# Patient Record
Sex: Male | Born: 1955 | Race: Black or African American | Hispanic: No | Marital: Single | Smoking: Current every day smoker
Health system: Southern US, Community
[De-identification: ages and names within clinical notes are randomized; demographics above are authoritative.]

## PROBLEM LIST (undated history)

## (undated) DIAGNOSIS — M1990 Unspecified osteoarthritis, unspecified site: Secondary | ICD-10-CM

## (undated) DIAGNOSIS — E785 Hyperlipidemia, unspecified: Secondary | ICD-10-CM

## (undated) DIAGNOSIS — I10 Essential (primary) hypertension: Secondary | ICD-10-CM

## (undated) DIAGNOSIS — K219 Gastro-esophageal reflux disease without esophagitis: Secondary | ICD-10-CM

## (undated) DIAGNOSIS — C169 Malignant neoplasm of stomach, unspecified: Secondary | ICD-10-CM

## (undated) HISTORY — DX: Essential (primary) hypertension: I10

## (undated) HISTORY — DX: Hyperlipidemia, unspecified: E78.5

## (undated) HISTORY — PX: TUMOR REMOVAL: SHX12

## (undated) HISTORY — DX: Unspecified osteoarthritis, unspecified site: M19.90

---

## 2009-06-27 ENCOUNTER — Emergency Department: Payer: Self-pay | Admitting: Internal Medicine

## 2009-10-12 ENCOUNTER — Inpatient Hospital Stay: Payer: Self-pay | Admitting: Specialist

## 2009-12-14 ENCOUNTER — Ambulatory Visit: Payer: Self-pay | Admitting: Gastroenterology

## 2009-12-15 ENCOUNTER — Ambulatory Visit: Payer: Self-pay | Admitting: Surgery

## 2009-12-29 ENCOUNTER — Ambulatory Visit: Payer: Self-pay | Admitting: Surgery

## 2010-01-03 ENCOUNTER — Inpatient Hospital Stay: Payer: Self-pay | Admitting: Surgery

## 2010-01-10 LAB — PATHOLOGY REPORT

## 2010-01-11 ENCOUNTER — Ambulatory Visit: Payer: Self-pay | Admitting: Cardiovascular Disease

## 2010-01-13 ENCOUNTER — Ambulatory Visit: Payer: Self-pay | Admitting: Oncology

## 2010-01-16 ENCOUNTER — Inpatient Hospital Stay: Payer: Self-pay | Admitting: Surgery

## 2010-01-28 ENCOUNTER — Ambulatory Visit: Payer: Self-pay | Admitting: Oncology

## 2010-02-13 ENCOUNTER — Ambulatory Visit: Payer: Self-pay | Admitting: Oncology

## 2010-02-14 ENCOUNTER — Other Ambulatory Visit: Payer: Self-pay | Admitting: Surgery

## 2010-02-17 ENCOUNTER — Ambulatory Visit: Payer: Self-pay | Admitting: Oncology

## 2010-02-24 ENCOUNTER — Ambulatory Visit: Payer: Self-pay | Admitting: Surgery

## 2010-03-16 ENCOUNTER — Ambulatory Visit: Payer: Self-pay | Admitting: Oncology

## 2010-04-18 ENCOUNTER — Ambulatory Visit: Payer: Self-pay | Admitting: Surgery

## 2010-04-21 ENCOUNTER — Ambulatory Visit: Payer: Self-pay | Admitting: Oncology

## 2010-05-03 ENCOUNTER — Ambulatory Visit: Payer: Self-pay | Admitting: Oncology

## 2010-05-15 ENCOUNTER — Ambulatory Visit: Payer: Self-pay | Admitting: Oncology

## 2010-08-02 ENCOUNTER — Ambulatory Visit: Payer: Self-pay | Admitting: Oncology

## 2010-08-08 ENCOUNTER — Ambulatory Visit: Payer: Self-pay | Admitting: Oncology

## 2010-08-14 ENCOUNTER — Ambulatory Visit: Payer: Self-pay | Admitting: Oncology

## 2010-11-02 ENCOUNTER — Ambulatory Visit: Payer: Self-pay | Admitting: Oncology

## 2010-11-07 ENCOUNTER — Ambulatory Visit: Payer: Self-pay | Admitting: Oncology

## 2010-11-14 ENCOUNTER — Ambulatory Visit: Payer: Self-pay | Admitting: Oncology

## 2011-02-21 ENCOUNTER — Ambulatory Visit: Payer: Self-pay | Admitting: Gastroenterology

## 2011-02-22 LAB — PATHOLOGY REPORT

## 2011-02-24 ENCOUNTER — Ambulatory Visit: Payer: Self-pay | Admitting: Gastroenterology

## 2011-03-06 ENCOUNTER — Ambulatory Visit: Payer: Self-pay | Admitting: Oncology

## 2011-03-10 ENCOUNTER — Ambulatory Visit: Payer: Self-pay | Admitting: Oncology

## 2011-03-10 LAB — CBC CANCER CENTER
Basophil %: 2.2 %
Eosinophil #: 0.1 x10 3/mm (ref 0.0–0.7)
Eosinophil %: 2.3 %
HCT: 46.1 % (ref 40.0–52.0)
HGB: 15.5 g/dL (ref 13.0–18.0)
Lymphocyte %: 36.4 %
MCV: 90 fL (ref 80–100)
Monocyte %: 15.7 %
Neutrophil #: 2.6 x10 3/mm (ref 1.4–6.5)
Neutrophil %: 43.4 %
RDW: 15.5 % — ABNORMAL HIGH (ref 11.5–14.5)
WBC: 5.9 x10 3/mm (ref 3.8–10.6)

## 2011-03-17 ENCOUNTER — Ambulatory Visit: Payer: Self-pay | Admitting: Oncology

## 2011-04-09 IMAGING — CT CT ABD-PELV W/O CM
1 of 2 series · 14 of 32 positions shown, 18 images · non-contrast
Comparison: none

REASON FOR EXAM: (1) gastric leak; (2) gastric leak
COMMENTS:

[Series 2: 3mm soft tissue · axial · 0.62mm/px · z∈[-662,-256]mm · 14 of 155 slices shown, 18 images]
[im 13/155  soft-tissue]
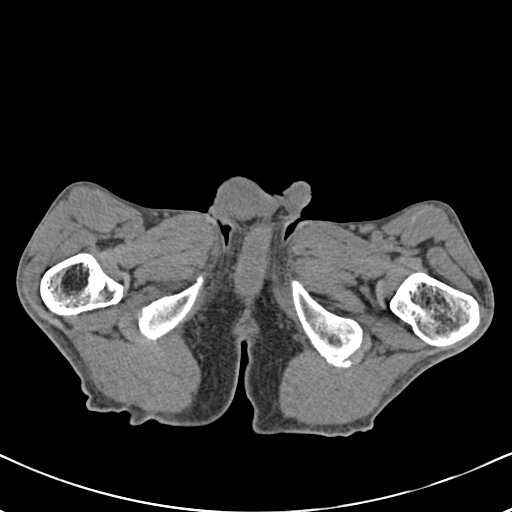
[im 13/155  bone]
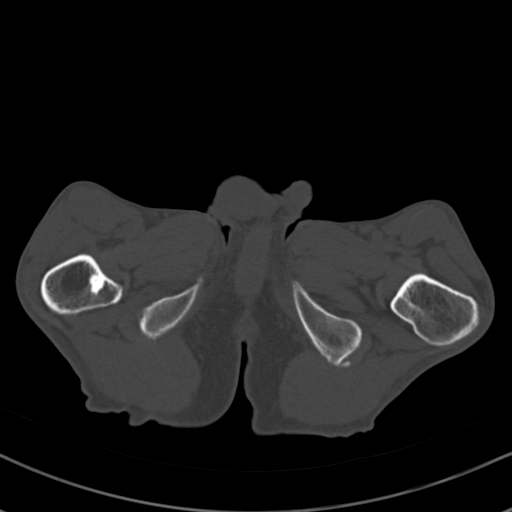
[im 25/155  soft-tissue]
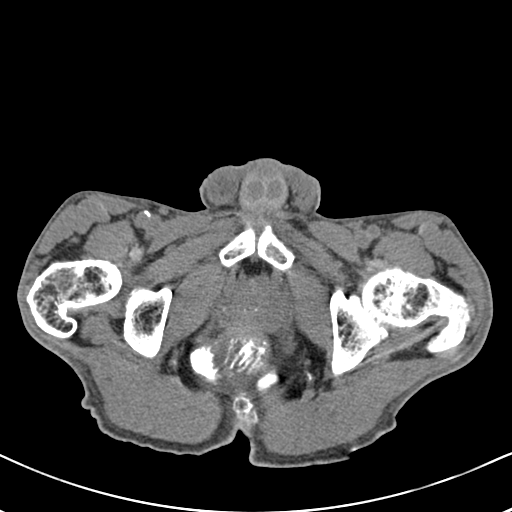
[im 37/155  soft-tissue]
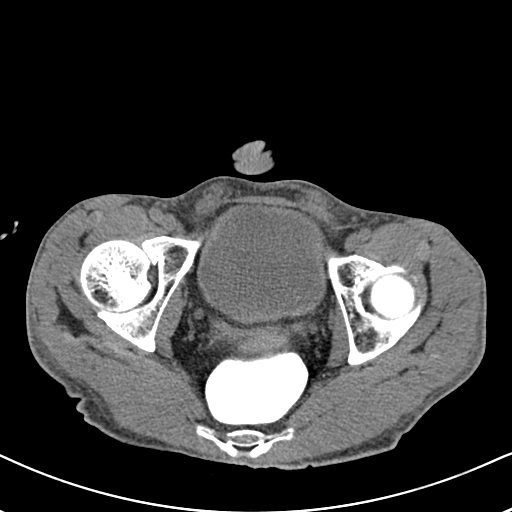
[im 50/155  soft-tissue]
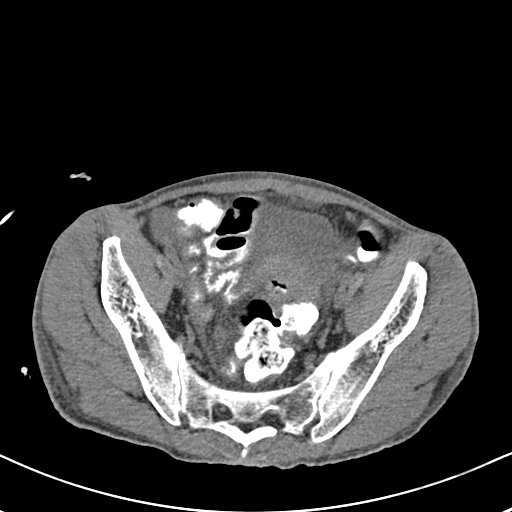
[im 62/155  soft-tissue]
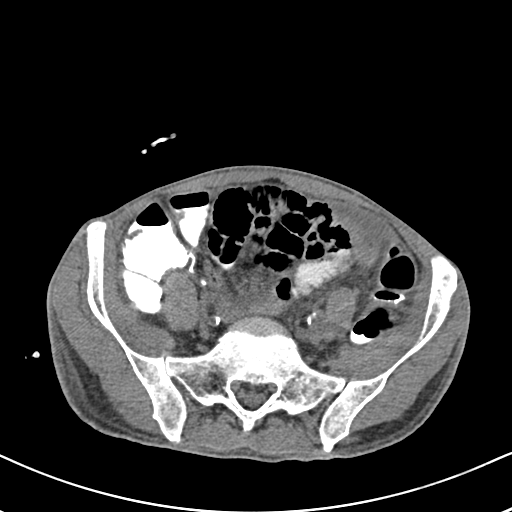
[im 74/155  soft-tissue]
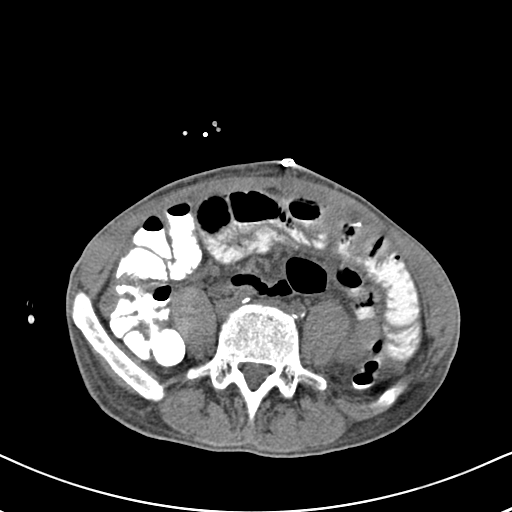
[im 87/155  soft-tissue]
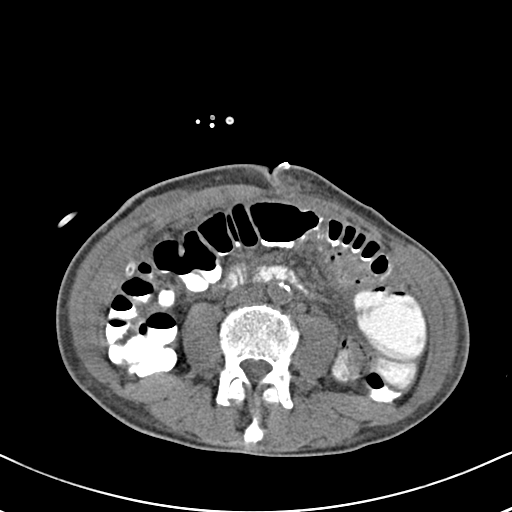
[im 99/155  soft-tissue]
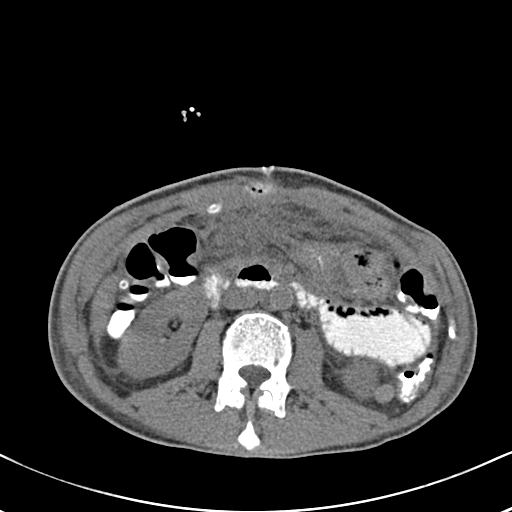
[im 111/155  soft-tissue]
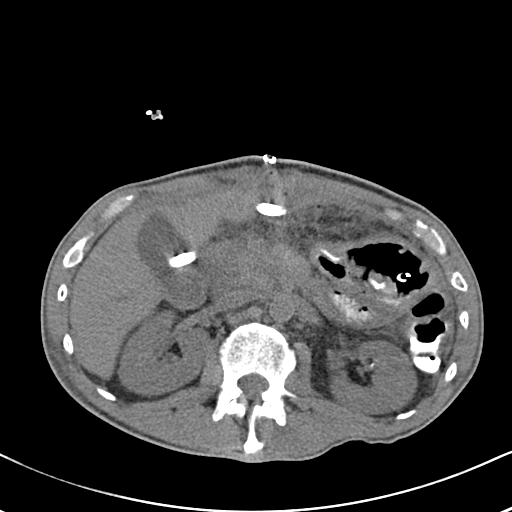
[im 111/155  bone]
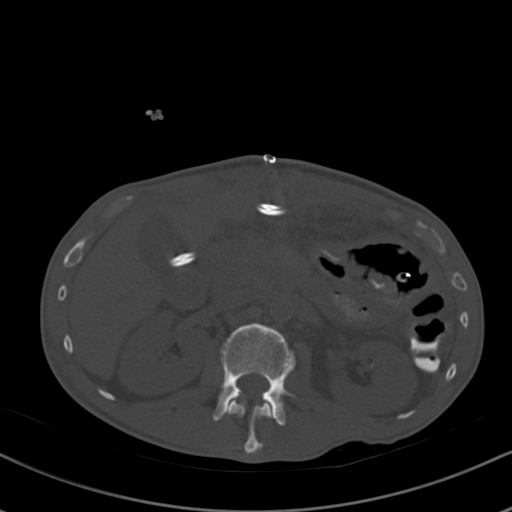
[im 124/155  soft-tissue]
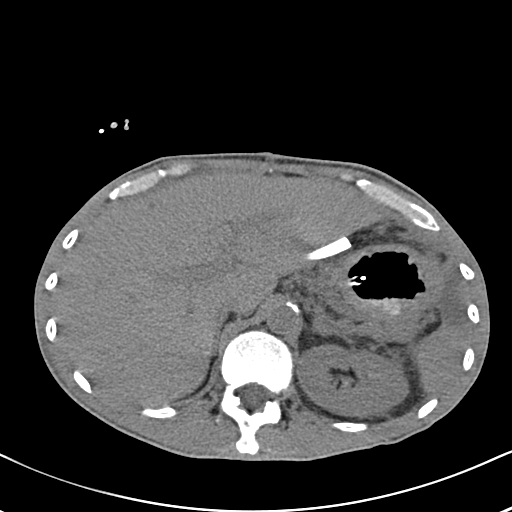
[im 130/155  lung]
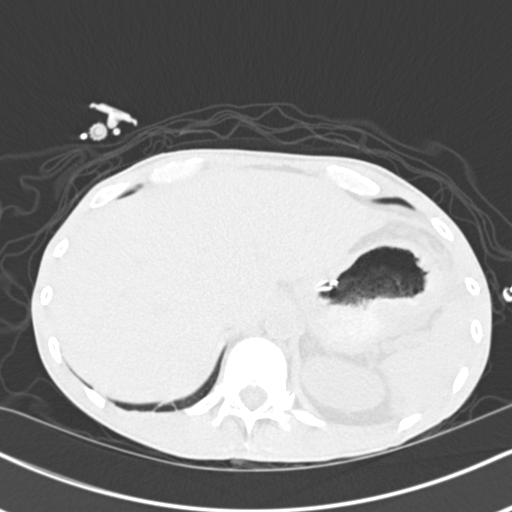
[im 136/155  soft-tissue]
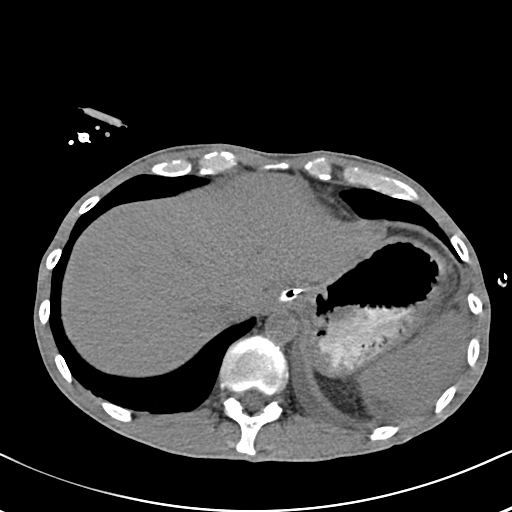
[im 136/155  lung]
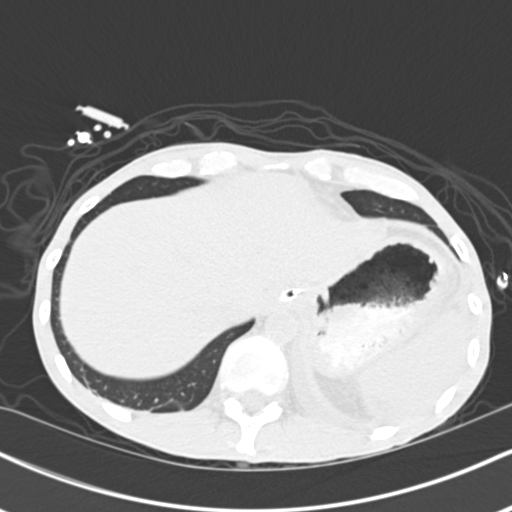
[im 142/155  lung]
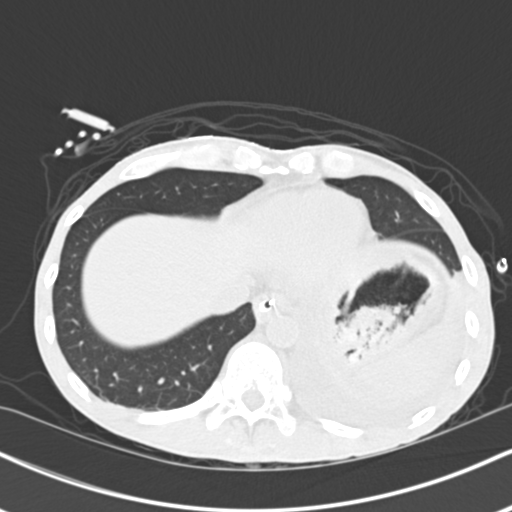
[im 148/155  soft-tissue]
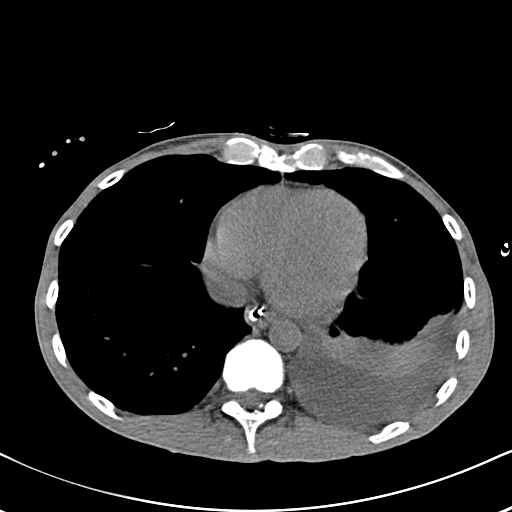
[im 148/155  lung]
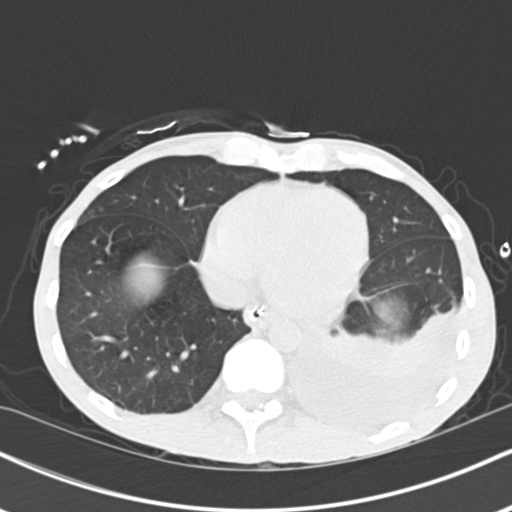

[14 of 32 positions shown; findings below may reference images not displayed]

PROCEDURE:     CT  - CT ABDOMEN AND PELVIS W[DATE]  [DATE]

RESULT:     Axial CT scanning was performed through the abdomen and pelvis
at 3 mm intervals and slice thicknesses. The patient did not receive IV
contrast but did receive oral contrast. Review of multiplanar reconstructed
images was performed separately on the VIA monitor.

The orally administered contrast has largely exited the stomach and lies
within portions of the small bowel with the majority in the large bowel in a
loading the rectum. I do not see definite extraluminal contrast collections.
There do remain inflammatory changes in the perigastric region. There are 2
drainage catheters in place in the anterior perigastric space. There is also
a nasogastric tube in place the tip lies in the region of the gastric
fundus. There is no evidence of ileus or obstruction of the small or large
bowel.

The liver exhibits no a focal mass. The gallbladder is adequately distended
with no calcified stones. The pancreatic head is poorly defined this is not
a new finding. The kidneys exhibit no evidence of obstruction but there are
punctate calcified stones present. The caliber of the abdominal aorta is
normal. Within the pelvis there is a moderate amount of free fluid posterior
to the urinary bladder anterior to the rectum. There is a small amount of
gas within the urinary bladder. The prostate gland produces moderate
impression upon the urinary bladder base.

There is a moderate size left pleural effusion and there is left basilar
atelectasis and/or infiltrate.
IMPRESSION: 1. I do not see evidence of extravasation of contrast into the perigastric
spaces. There do remain inflammatory changes in the perigastric soft
tissues. The orally administered contrast has traversed the bowel and
reached the rectum. There is no evidence of ileus or obstruction. There is a
small amount of air within the ventral incision with surgical clips present
within the skin. This will merit followup.
2. There are nonobstructing kidney stones bilaterally. There is a small
amount of air within the urinary bladder which may be from previous
catheterization or could reflect infection.
3. There is moderate sized left pleural effusion which is new since the
previous study. There is also left basilar atelectasis.
4. There is a small amount of free fluid in the pelvis.

## 2011-04-14 IMAGING — XA DG CHEST 1V
2 series · 3 of 3 positions shown · IV contrast (IODINE)
Comparison: none

REASON FOR EXAM: PICC line placement
COMMENTS:

[Series 2: venogram · 2 of 2 slices shown]
[im 1/2]
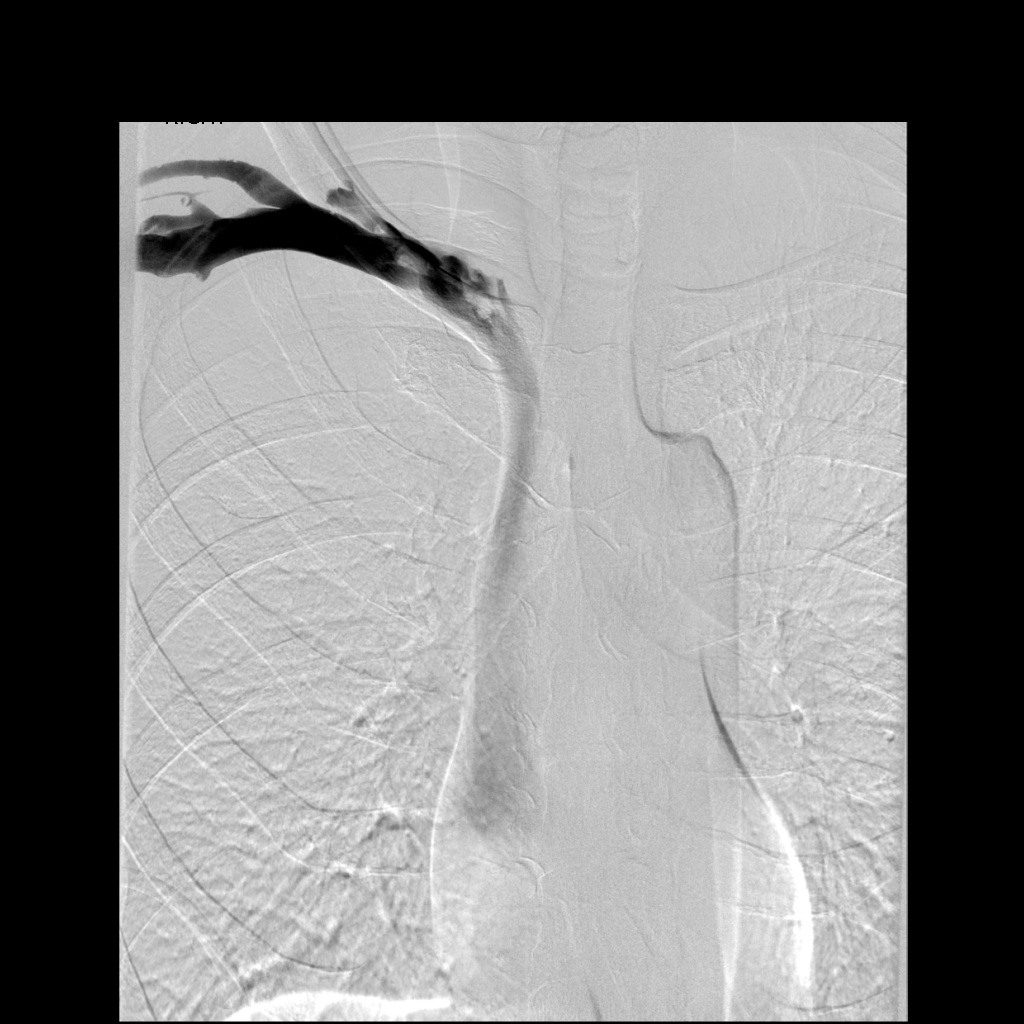
[im 2/2]
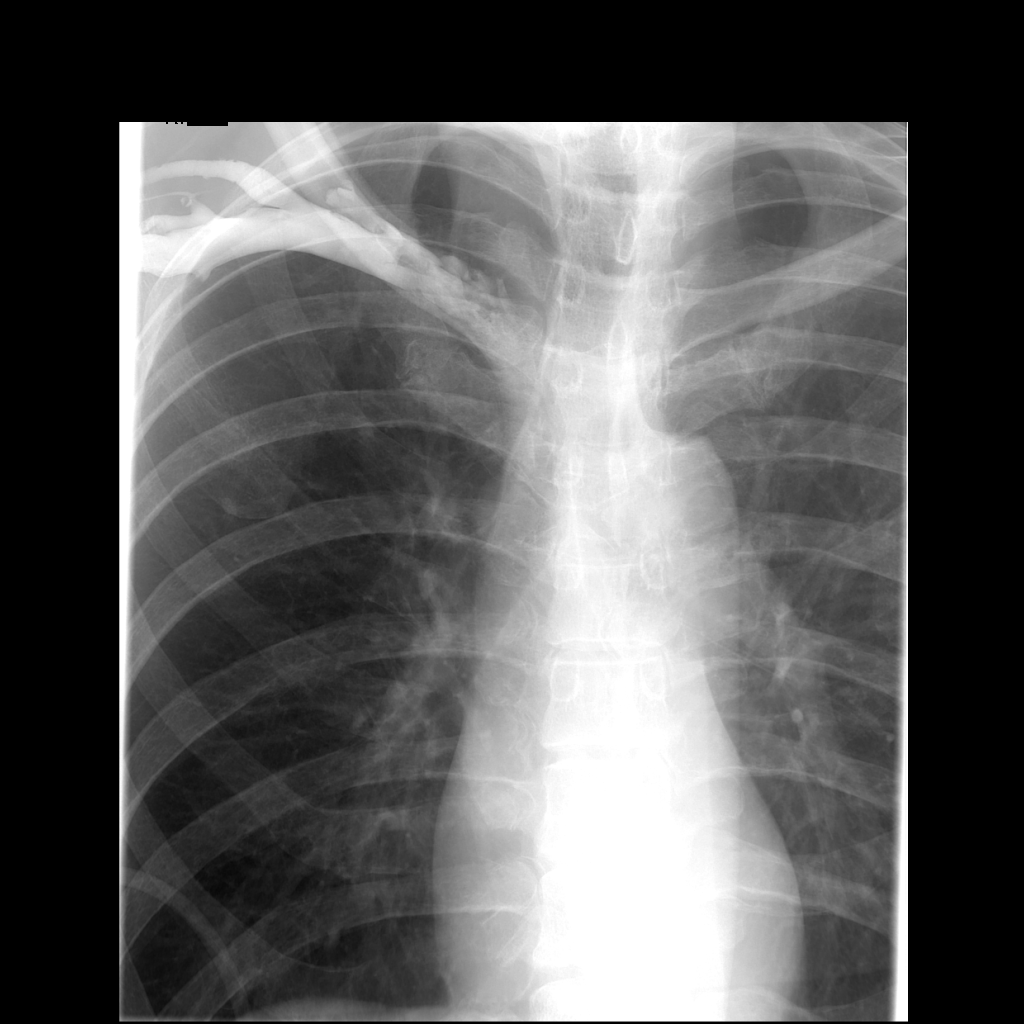

[Series 3: single · 1 of 1 slices shown]
[im 1/1]
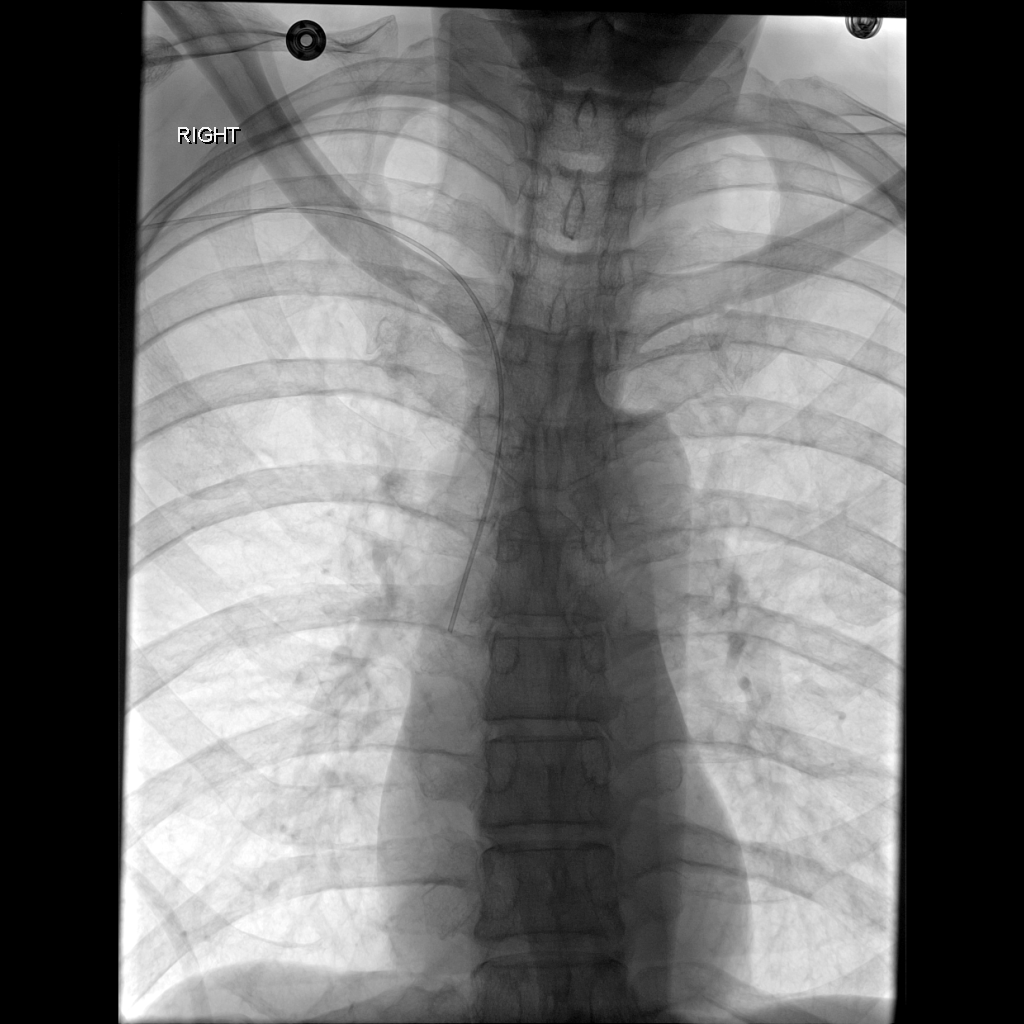

[3 of 3 positions shown; findings below may reference images not displayed]

PROCEDURE:     VAS - CHEST FRONTAL SINGLE VIEW  - January 31, 2010  [DATE]

RESULT:     Comparison: None.

Technique and Findings:
Intraprocedural fluoroscopic images and a venogram were submitted. The
venogram image demonstrates several rounded filling defects in the right
subclavian vein at the confluence of the right internal jugular vein. There
was contrast opacification into the right atrium. The PICC catheter tip
terminates at the inferior SVC.
IMPRESSION: 1. PICC catheter tip at the inferior SVC.
2. Findings of nonocclusive intraluminal thrombus at the confluence of the
brachiocephalic vein.

This was discussed with Dr. Fareed Duron at 0131 hours 01/31/2010.

## 2011-09-27 ENCOUNTER — Ambulatory Visit: Payer: Self-pay | Admitting: Oncology

## 2013-05-19 ENCOUNTER — Ambulatory Visit: Payer: Self-pay | Admitting: Vascular Surgery

## 2013-05-19 LAB — CREATININE, SERUM
Creatinine: 1.29 mg/dL (ref 0.60–1.30)
EGFR (Non-African Amer.): >60

## 2013-05-19 LAB — BUN: BUN: 9 mg/dL (ref 7–18)

## 2014-03-16 ENCOUNTER — Ambulatory Visit: Payer: Self-pay | Admitting: Vascular Surgery

## 2014-03-16 LAB — CREATININE, SERUM
Creatinine: 1.17 mg/dL (ref 0.60–1.30)
EGFR (African American): >60
EGFR (Non-African Amer.): >60

## 2014-03-16 LAB — BUN: BUN: 12 mg/dL (ref 7–18)

## 2014-06-06 NOTE — Op Note (Signed)
PATIENT NAME:  Cory Martin, Cory Martin MR#:  [id number] DATE OF BIRTH:  03-19-1955  DATE OF PROCEDURE:  05/19/2013  PREOPERATIVE DIAGNOSES:  1.  Peripheral arterial disease with claudication, bilateral lower extremities.  2.  Tobacco dependence.  3.  Hypertension.  4.  Hyperlipidemia.   POSTOPERATIVE DIAGNOSES: 1.  Peripheral arterial disease with claudication, bilateral lower extremities.  2.  Tobacco dependence.  3.  Hypertension.  4.  Hyperlipidemia.   PROCEDURES:  1.  Catheter placement to right peroneal artery from left femoral approach.  2.  Aortogram and selective right lower extremity angiogram.  3.  Percutaneous transluminal angioplasty of right peroneal artery and tibioperoneal trunk with 3 mm diameter angioplasty balloon.  4.  Percutaneous transluminal angioplasty of right common femoral artery and proximal superficial femoral artery with 5 mm diameter Lutonix drug-coated angioplasty balloon.  5.  Percutaneous transluminal angioplasty of right common femoral artery and proximal superficial femoral artery with 6 mm diameter conventional angioplasty balloon.  6.  StarClose closure device, left femoral artery.   SURGEON: Leotis Pain, M.D.   ANESTHESIA: Local with moderate conscious sedation.   ESTIMATED BLOOD LOSS: Minimal.   FLUOROSCOPY TIME: Approximately 5 minutes.   CONTRAST USED: 55 mL.   INDICATION FOR PROCEDURE: This is a 59 year old African American male with claudication symptoms of bilateral lower extremities. These have worsened despite attempts at conservative therapy. He desired an angiogram with possible intervention for further evaluation and potential treatment. It was discussed important lifestyle factors should still be pursued such as smoking cessation and continuing to exercise. Informed consent was obtained.   DESCRIPTION OF PROCEDURE: The patient was brought to the vascular suite. Groins were shaved and prepped and a sterile surgical field was created. The left  femoral head was localized with fluoroscopy. The left femoral artery was accessed with minimal amount of difficulty with a Seldinger needle. A J-wire and 5-French sheath were then placed. Pigtail catheter was placed into the aorta at the L1-L2 level and AP aortogram was performed. This showed small aorta and iliac vessels without flow-limiting stenosis. The renal arteries appeared patent bilaterally. I then used the pigtail catheter and a Terumo advantage wire and crossed the aortic bifurcation and advanced the pigtail catheter to distal right external iliac artery and selective right lower extremity angiogram was then performed. This demonstrated about a 65% to 70% stenosis starting within the common femoral artery and tracking down to the proximal superficial femoral artery, which did appear to be flow limiting. The remainder of the SFA and the popliteal artery were patent. He then had a moderate stenosis in the 70% range in the tibioperoneal trunk tracking down into the peroneal artery. The anterior tibial artery was patent, but had about a 40% stenosis in its proximal segment. This does not appear flow limiting. The posterior tibial artery was limited by the tibioperoneal trunk stenosis, but itself was patent to the foot.   The patient was then systemically heparinized with 3500 units of intravenous heparin and I crossed the aortic bifurcation with a 6-French high flex Ansel sheath over the Terumo advantage wire. I took a Kumpe catheter distally and was able to navigate through the common femoral SFA lesion and into the tibial vessels. I was able to cross the tibioperoneal trunk lesion into the peroneal artery and treat this area with a 3 mm diameter angioplasty balloon with a good angiographic result. There was about a 20% to 30% residual stenosis that did not appear flow-limiting. At this point, I turned my attention to  the proximal SFA and common femoral area. I initially treated this artery with a 5 mm  diameter x 8 cm in length drug-coated Lutonix balloon. Completion angiogram following this showed about a 50% residual stenosis. I elected to up size to a 6 mm diameter conventional angioplasty balloon and with this, there was significantly improved flow and only about a 25% to 30% residual stenosis that was not flow limiting. At this point, I elected to terminate the procedure. The sheath was pulled back to the ipsilateral external iliac artery and oblique arteriogram was performed. StarClose closure device was deployed in the usual fashion with an excellent hemostatic result. The patient tolerated the procedure well and was taken to the recovery room in stable condition.    ____________________________ Cory Huxley, MD jsd:aw D: 05/19/2013 10:45:56 ET T: 05/19/2013 13:19:31 ET JOB#: [id number]  cc: Cory Huxley, MD, <Dictator> Sarah "Sallie" Posey Pronto, MD Cory Huxley MD ELECTRONICALLY SIGNED 05/29/2013 15:27

## 2014-06-14 NOTE — Op Note (Signed)
PATIENT NAME:  Cory Martin, Cory Martin MR#:  [id number] DATE OF BIRTH:  [birth year]  DATE OF PROCEDURE:  03/16/2014  PREOPERATIVE DIAGNOSES:  1.  Peripheral arterial disease with claudication, right lower extremity.  2.  Hyperlipidemia.  3.  Hypertension.  POSTOPERATIVE DIAGNOSES: 1.  Peripheral arterial disease with claudication, right lower extremity.  2.  Hyperlipidemia.  3.  Hypertension.    PROCEDURES:  1.  Ultrasound guidance for vascular access to left femoral artery.  2.  Catheter placement into the right peroneal artery from a left femoral approach.  3.  Aortogram and selective right lower extremity angiogram.  4.  Percutaneous transluminal angioplasty of proximal right superficial femoral artery with a 5 mm diameter Lutonix drug-coated angioplasty balloon.  5.  Percutaneous transluminal angioplasty of the tibioperoneal trunk and proximal peroneal artery with 3 mm diameter angioplasty balloon.  6.  StarClose closure device, right femoral artery.   SURGEON: [name], MD   ANESTHESIA: Local with moderate conscious sedation.   ESTIMATED BLOOD LOSS: 25 mL.   INDICATION FOR PROCEDURE: This is a 59 year old gentleman with significant peripheral arterial disease. He has had a drop in his right ABI of over 30 points, with duplex showing areas of stenosis. He is brought in for angiography due to the worsening nature of his symptoms and the lifestyle limitation, risks and benefits were discussed. Informed consent was obtained.   DESCRIPTION OF PROCEDURE: The patient was brought to the vascular suite. Groins were shaved and prepped, and a sterile surgical field was created. The left femoral head was visualized on ultrasound. The left femoral artery was accessed under direct ultrasound guidance without difficulty with a Seldinger needle. A J-wire and 5 French sheath were placed. Pigtail catheter was placed in the aorta at the L1 level and an AP aortogram was performed. This showed what appeared  to be mild non-hemodynamically significant right renal artery stenosis and no significant left renal artery stenosis. The aorta and iliac segments were small but patent. I then crossed the aortic bifurcation and advanced to the right femoral head and selective right lower extremity angiogram was then performed. This showed a very high-grade near occlusive stenosis at the origin of the SFA over the first several centimeters; the SFA then normalized. The common femoral artery had some mild disease. The profunda femoris artery appeared to be widely patent. The popliteal artery was patent. There was an atypical tibial trifurcation with the tibioperoneal trunk had a moderate to high-grade stenosis that tracked down into the peroneal artery. The anterior tibial artery was the best runoff initially, but the tibioperoneal trunk stenosis certainly threatened the remainder of the peroneal artery and the posterior tibial artery, which were patent other than the proximal stenosis. I elected to treat this area as well as the proximal SFA lesion to try to improve his symptoms. Treating the distal lesion will also preserve his runoff going forward, as this tibioperoneal trunk lesion threatens 2 runoff vessels, and he is only 59 years old.   The patient was given 4000 units of intravenous heparin and A 6 [place] Ansell sheath was placed over a Terumo Advantage wire. With the help of a Kumpe catheter and the Advantage wire, I was able to navigate through the SFA stenosis without difficulty and confirm intraluminal flow in the distal SFA and above-knee popliteal artery. I was then able to navigate into the tibioperoneal trunk and advance a wire and catheter into the peroneal artery, parking the wire in the distal peroneal artery. The proximal SFA  lesion was treated with a 5 mm diameter x 8 cm length Lutonix drug-coated angioplasty balloon inflated to 10 atmospheres. The inflation was held for 1 minute, then the balloon was deflated.  A completion angiogram showed markedly improved flow with only about a 10% to 15% residual stenosis.   I then turned my attention to the tibial lesion. The tibioperoneal trunk and proximal peroneal artery were treated with a 3 mm diameter angioplasty balloon with an excellent angiographic result after the balloon was deflated and removed, and 3-vessel runoff distally.   At this point I elected to terminate the procedure. The sheath was pulled back to the ipsilateral external iliac artery and the left arteriogram was performed. The StarClose closure device was deployed in the usual fashion with excellent hemostatic result.   The patient tolerated the procedure well and was taken to the recovery room in stable condition.    ____________________________ [name], MD jsd:MT D: 03/16/2014 09:02:32 ET T: 03/16/2014 09:28:55 ET JOB#: [id number]  cc: [name], MD, <Dictator> Sarah "Sallie" Posey Pronto, MD [name] MD ELECTRONICALLY SIGNED 03/18/2014 11:55

## 2016-03-07 ENCOUNTER — Ambulatory Visit (INDEPENDENT_AMBULATORY_CARE_PROVIDER_SITE_OTHER): Payer: Medicare Other | Admitting: Vascular Surgery

## 2016-03-07 ENCOUNTER — Encounter (INDEPENDENT_AMBULATORY_CARE_PROVIDER_SITE_OTHER): Payer: Medicare Other

## 2016-03-23 ENCOUNTER — Other Ambulatory Visit (INDEPENDENT_AMBULATORY_CARE_PROVIDER_SITE_OTHER): Payer: Self-pay | Admitting: Vascular Surgery

## 2016-04-18 ENCOUNTER — Other Ambulatory Visit (INDEPENDENT_AMBULATORY_CARE_PROVIDER_SITE_OTHER): Payer: Medicare Other

## 2016-04-18 ENCOUNTER — Ambulatory Visit (INDEPENDENT_AMBULATORY_CARE_PROVIDER_SITE_OTHER): Payer: Medicare Other | Admitting: Vascular Surgery

## 2016-04-18 ENCOUNTER — Encounter (INDEPENDENT_AMBULATORY_CARE_PROVIDER_SITE_OTHER): Payer: Self-pay

## 2016-04-18 ENCOUNTER — Other Ambulatory Visit (INDEPENDENT_AMBULATORY_CARE_PROVIDER_SITE_OTHER): Payer: Self-pay | Admitting: Vascular Surgery

## 2016-04-18 ENCOUNTER — Encounter (INDEPENDENT_AMBULATORY_CARE_PROVIDER_SITE_OTHER): Payer: Self-pay | Admitting: Vascular Surgery

## 2016-04-18 VITALS — BP 133/80 | HR 59 | Resp 17 | Ht 58.5 in | Wt 121.6 lb

## 2016-04-18 DIAGNOSIS — F17210 Nicotine dependence, cigarettes, uncomplicated: Secondary | ICD-10-CM | POA: Diagnosis not present

## 2016-04-18 DIAGNOSIS — I70211 Atherosclerosis of native arteries of extremities with intermittent claudication, right leg: Secondary | ICD-10-CM

## 2016-04-18 DIAGNOSIS — F17200 Nicotine dependence, unspecified, uncomplicated: Secondary | ICD-10-CM | POA: Insufficient documentation

## 2016-04-18 DIAGNOSIS — I10 Essential (primary) hypertension: Secondary | ICD-10-CM | POA: Diagnosis not present

## 2016-04-18 DIAGNOSIS — I70219 Atherosclerosis of native arteries of extremities with intermittent claudication, unspecified extremity: Secondary | ICD-10-CM | POA: Insufficient documentation

## 2016-04-18 NOTE — Assessment & Plan Note (Signed)
blood pressure control important in reducing the progression of atherosclerotic disease. On appropriate oral medications.  

## 2016-04-18 NOTE — Patient Instructions (Signed)
Peripheral Vascular Disease Peripheral vascular disease (PVD) is a disease of the blood vessels that are not part of your heart and brain. A simple term for PVD is poor circulation. In most cases, PVD narrows the blood vessels that carry blood from your heart to the rest of your body. This can result in a decreased supply of blood to your arms, legs, and internal organs, like your stomach or kidneys. However, it most often affects a person's lower legs and feet. There are two types of PVD.  Organic PVD. This is the more common type. It is caused by damage to the structure of blood vessels.  Functional PVD. This is caused by conditions that make blood vessels contract and tighten (spasm).  Without treatment, PVD tends to get worse over time. PVD can also lead to acute ischemic limb. This is when an arm or limb suddenly has trouble getting enough blood. This is a medical emergency. What are the causes? Each type of PVD has many different causes. The most common cause of PVD is buildup of a fatty material (plaque) inside of your arteries (atherosclerosis). Small amounts of plaque can break off from the walls of the blood vessels and become lodged in a smaller artery. This blocks blood flow and can cause acute ischemic limb. Other common causes of PVD include:  Blood clots that form inside of blood vessels.  Injuries to blood vessels.  Diseases that cause inflammation of blood vessels or cause blood vessel spasms.  Health behaviors and health history that increase your risk of developing PVD.  What increases the risk? You may have a greater risk of PVD if you:  Have a family history of PVD.  Have certain medical conditions, including: ? High cholesterol. ? Diabetes. ? High blood pressure (hypertension). ? Coronary heart disease. ? Past problems with blood clots. ? Past injury, such as burns or a broken bone. These may have damaged blood vessels in your limbs. ? Buerger disease. This is  caused by inflamed blood vessels in your hands and feet. ? Some forms of arthritis. ? Rare birth defects that affect the arteries in your legs.  Use tobacco.  Do not get enough exercise.  Are obese.  Are age 50 or older.  What are the signs or symptoms? PVD may cause many different symptoms. Your symptoms depend on what part of your body is not getting enough blood. Some common signs and symptoms include:  Cramps in your lower legs. This may be a symptom of poor leg circulation (claudication).  Pain and weakness in your legs while you are physically active that goes away when you rest (intermittent claudication).  Leg pain when at rest.  Leg numbness, tingling, or weakness.  Coldness in a leg or foot, especially when compared with the other leg.  Skin or hair changes. These can include: ? Hair loss. ? Shiny skin. ? Pale or bluish skin. ? Thick toenails.  Inability to get or maintain an erection (erectile dysfunction).  People with PVD are more prone to developing ulcers and sores on their toes, feet, or legs. These may take longer than normal to heal. How is this diagnosed? Your health care provider may diagnose PVD from your signs and symptoms. The health care provider will also do a physical exam. You may have tests to find out what is causing your PVD and determine its severity. Tests may include:  Blood pressure recordings from your arms and legs and measurements of the strength of your pulses (  pulse volume recordings).  Imaging studies using sound waves to take pictures of the blood flow through your blood vessels (Doppler ultrasound).  Injecting a dye into your blood vessels before having imaging studies using: ? X-rays (angiogram or arteriogram). ? Computer-generated X-rays (CT angiogram). ? A powerful electromagnetic field and a computer (magnetic resonance angiogram or MRA).  How is this treated? Treatment for PVD depends on the cause of your condition and the  severity of your symptoms. It also depends on your age. Underlying causes need to be treated and controlled. These include long-lasting (chronic) conditions, such as diabetes, high cholesterol, and high blood pressure. You may need to first try making lifestyle changes and taking medicines. Surgery may be needed if these do not work. Lifestyle changes may include:  Quitting smoking.  Exercising regularly.  Following a low-fat, low-cholesterol diet.  Medicines may include:  Blood thinners to prevent blood clots.  Medicines to improve blood flow.  Medicines to improve your blood cholesterol levels.  Surgical procedures may include:  A procedure that uses an inflated balloon to open a blocked artery and improve blood flow (angioplasty).  A procedure to put in a tube (stent) to keep a blocked artery open (stent implant).  Surgery to reroute blood flow around a blocked artery (peripheral bypass surgery).  Surgery to remove dead tissue from an infected wound on the affected limb.  Amputation. This is surgical removal of the affected limb. This may be necessary in cases of acute ischemic limb that are not improved through medical or surgical treatments.  Follow these instructions at home:  Take medicines only as directed by your health care provider.  Do not use any tobacco products, including cigarettes, chewing tobacco, or electronic cigarettes. If you need help quitting, ask your health care provider.  Lose weight if you are overweight, and maintain a healthy weight as directed by your health care provider.  Eat a diet that is low in fat and cholesterol. If you need help, ask your health care provider.  Exercise regularly. Ask your health care provider to suggest some good activities for you.  Use compression stockings or other mechanical devices as directed by your health care provider.  Take good care of your feet. ? Wear comfortable shoes that fit well. ? Check your feet  often for any cuts or sores. Contact a health care provider if:  You have cramps in your legs while walking.  You have leg pain when you are at rest.  You have coldness in a leg or foot.  Your skin changes.  You have erectile dysfunction.  You have cuts or sores on your feet that are not healing. Get help right away if:  Your arm or leg turns cold and blue.  Your arms or legs become red, warm, swollen, painful, or numb.  You have chest pain or trouble breathing.  You suddenly have weakness in your face, arm, or leg.  You become very confused or lose the ability to speak.  You suddenly have a very bad headache or lose your vision. This information is not intended to replace advice given to you by your health care provider. Make sure you discuss any questions you have with your health care provider. Document Released: 03/09/2004 Document Revised: 07/08/2015 Document Reviewed: 07/10/2013 [employer] Interactive Patient Education  2017 [employer]  

## 2016-04-18 NOTE — Progress Notes (Signed)
MRN : [id number]  Cory Martin is a 61 y.o. (October 18, 1955) male who presents with chief complaint of  Chief Complaint  Patient presents with  . Follow-up  .  History of Present Illness: Patient returns today in follow up of PAD. He reports mild claudication symptoms of the right lower extremity. These are bothersome but not necessarily lifestyle limiting currently. He continues to smoke. His noninvasive studies today show areas of 50-74% stenosis in the right SFA and popliteal arteries with preserved normal ABIs bilaterally of 1.02 on the right and 1.06 on the left.  Current Outpatient Prescriptions  Medication Sig Dispense Refill  . amLODipine (NORVASC) 2.5 MG tablet     . aspirin 81 MG EC tablet TAKE 1 TABLET BY MOUTH EVERY DAY FOR HEART AND VASCULAR PROTECTION  3  . clopidogrel (PLAVIX) 75 MG tablet TAKE 1 TABLET BY MOUTH EVERY DAY 90 tablet 1  . lisinopril (PRINIVIL,ZESTRIL) 10 MG tablet     . pravastatin (PRAVACHOL) 20 MG tablet     . ranitidine (ZANTAC) 150 MG tablet TAKE 1 TABLET BY MOUTH EVERY DAY FOR HEARTBURN  3   No current facility-administered medications for this visit.     Past Medical History:  Diagnosis Date  . Arthritis   . Hyperlipidemia   . Hypertension     Past Surgical History:  Procedure Laterality Date  . TUMOR REMOVAL     abdominal    Social History Social History  Substance Use Topics  . Smoking status: Current Every Day Smoker  . Smokeless tobacco: Never Used  . Alcohol use No     Family History Family History  Problem Relation Age of Onset  . Cancer Mother   . Cancer Father      No Known Allergies   REVIEW OF SYSTEMS (Negative unless checked)  Constitutional: [] Weight loss  [] Fever  [] Chills Cardiac: [] Chest pain   [] Chest pressure   [] Palpitations   [] Shortness of breath when laying flat   [] Shortness of breath at rest   [] Shortness of breath with exertion. Vascular:  [x] Pain in legs with walking   [] Pain in legs at rest    [] Pain in legs when laying flat   [x] Claudication   [] Pain in feet when walking  [] Pain in feet at rest  [] Pain in feet when laying flat   [] History of DVT   [] Phlebitis   [] Swelling in legs   [] Varicose veins   [] Non-healing ulcers Pulmonary:   [] Uses home oxygen   [] Productive cough   [] Hemoptysis   [] Wheeze  [] COPD   [] Asthma Neurologic:  [] Dizziness  [] Blackouts   [] Seizures   [] History of stroke   [] History of TIA  [] Aphasia   [] Temporary blindness   [] Dysphagia   [] Weakness or numbness in arms   [] Weakness or numbness in legs Musculoskeletal:  [] Arthritis   [] Joint swelling   [] Joint pain   [] Low back pain Hematologic:  [] Easy bruising  [] Easy bleeding   [] Hypercoagulable state   [] Anemic   Gastrointestinal:  [] Blood in stool   [] Vomiting blood  [] Gastroesophageal reflux/heartburn   [] Abdominal pain Genitourinary:  [] Chronic kidney disease   [] Difficult urination  [] Frequent urination  [] Burning with urination   [] Hematuria Skin:  [] Rashes   [] Ulcers   [] Wounds Psychological:  [] History of anxiety   []  History of major depression.  Physical Examination  BP 133/80   Pulse (!) 59   Resp 17   Ht 4' 10.5" (1.486 m)   Wt 121 lb 9.6 oz (55.2  kg)   BMI 24.98 kg/m  Gen:  WD/WN, NAD Head: [place]/AT, No temporalis wasting. Ear/Nose/Throat: Hearing grossly intact, nares w/o erythema or drainage, trachea midline Eyes: Conjunctiva clear. Sclera non-icteric Neck: Supple.  No JVD.  Pulmonary:  Good air movement, no use of accessory muscles.  Cardiac: RRR, normal S1, S2 Vascular:  Vessel Right Left  Radial Palpable Palpable  Ulnar Palpable Palpable  Brachial Palpable Palpable  Carotid Palpable, without bruit Palpable, without bruit  Aorta Not palpable N/A  Femoral Palpable Palpable  Popliteal Palpable Palpable  PT 1+ Palpable Palpable  DP Palpable 1+ Palpable   Gastrointestinal: soft, non-tender/non-distended. No guarding/reflex.  Musculoskeletal: M/S 5/5 throughout.  No deformity or  atrophy. Neurologic: Sensation grossly intact in extremities.  Symmetrical.  Speech is fluent.  Psychiatric: Judgment intact, Mood & affect appropriate for pt's clinical situation. Dermatologic: No rashes or ulcers noted.  No cellulitis or open wounds. Lymph : No Cervical, Axillary, or Inguinal lymphadenopathy.      Labs Recent Results (from the past 2160 hour(s))  VAS [place] LOWER EXTREMITY ARTERIAL DUPLEX     Status: None (In process)   Collection Time: 04/18/16 10:15 AM  Result Value Ref Range   Right super femoral prox sys PSV 360 cm/s   Right super femoral mid sys PSV -74 cm/s   Right super femoral dist sys PSV -91 cm/s   Right peroneal sys PSV 27 cm/s   RIGHT ANT DIST TIBAL SYS PSV 51 cm/s   RIGHT POST TIB DIST SYS 47 cm/s    Radiology No results found.    Assessment/Plan  Tobacco use disorder We had a discussion for approximately 3-4 minutes regarding the absolute need for smoking cessation due to the deleterious nature of tobacco on the vascular system. We discussed the tobacco use would diminish patency of any intervention, and likely significantly worsen progressio of disease. We discussed multiple agents for quitting including replacement therapy or medications to reduce cravings such as Chantix. The patient voices their understanding of the importance of smoking cessation.   Essential hypertension, benign blood pressure control important in reducing the progression of atherosclerotic disease. On appropriate oral medications.   Atherosclerosis of native arteries of extremity with intermittent claudication (HCC) His noninvasive studies today show areas of 50-74% stenosis in the right SFA and popliteal arteries with preserved normal ABIs bilaterally of 1.02 on the right and 1.06 on the left. His claudication symptoms remain very mild at this point. No intervention is required. Plan to recheck in 6 months with noninvasive studies.    Leotis Pain, MD  04/18/2016 1:17  PM    This note was created with Dragon medical transcription system.  Any errors from dictation are purely unintentional

## 2016-04-18 NOTE — Assessment & Plan Note (Signed)
His noninvasive studies today show areas of 50-74% stenosis in the right SFA and popliteal arteries with preserved normal ABIs bilaterally of 1.02 on the right and 1.06 on the left. His claudication symptoms remain very mild at this point. No intervention is required. Plan to recheck in 6 months with noninvasive studies.

## 2016-04-18 NOTE — Assessment & Plan Note (Signed)
We had a discussion for approximately 3-4 minutes regarding the absolute need for smoking cessation due to the deleterious nature of tobacco on the vascular system. We discussed the tobacco use would diminish patency of any intervention, and likely significantly worsen progressio of disease. We discussed multiple agents for quitting including replacement therapy or medications to reduce cravings such as Chantix. The patient voices their understanding of the importance of smoking cessation.  

## 2016-09-12 ENCOUNTER — Other Ambulatory Visit (INDEPENDENT_AMBULATORY_CARE_PROVIDER_SITE_OTHER): Payer: Self-pay | Admitting: Vascular Surgery

## 2016-10-20 ENCOUNTER — Ambulatory Visit (INDEPENDENT_AMBULATORY_CARE_PROVIDER_SITE_OTHER): Payer: Medicare Other | Admitting: Vascular Surgery

## 2016-10-20 ENCOUNTER — Ambulatory Visit (INDEPENDENT_AMBULATORY_CARE_PROVIDER_SITE_OTHER): Payer: Medicare Other

## 2016-10-20 ENCOUNTER — Encounter (INDEPENDENT_AMBULATORY_CARE_PROVIDER_SITE_OTHER): Payer: Self-pay

## 2016-10-20 DIAGNOSIS — I70211 Atherosclerosis of native arteries of extremities with intermittent claudication, right leg: Secondary | ICD-10-CM

## 2016-10-20 NOTE — Progress Notes (Signed)
Patient left after ultrasound and results can be obtained via phone conversation.

## 2016-11-16 ENCOUNTER — Encounter (INDEPENDENT_AMBULATORY_CARE_PROVIDER_SITE_OTHER): Payer: Self-pay | Admitting: Vascular Surgery

## 2017-01-17 ENCOUNTER — Other Ambulatory Visit (INDEPENDENT_AMBULATORY_CARE_PROVIDER_SITE_OTHER): Payer: Self-pay | Admitting: Vascular Surgery

## 2017-01-17 DIAGNOSIS — I70211 Atherosclerosis of native arteries of extremities with intermittent claudication, right leg: Secondary | ICD-10-CM

## 2017-01-19 ENCOUNTER — Ambulatory Visit (INDEPENDENT_AMBULATORY_CARE_PROVIDER_SITE_OTHER): Payer: Medicare Other | Admitting: Vascular Surgery

## 2017-01-19 ENCOUNTER — Encounter (INDEPENDENT_AMBULATORY_CARE_PROVIDER_SITE_OTHER): Payer: Medicare Other

## 2017-03-11 ENCOUNTER — Other Ambulatory Visit (INDEPENDENT_AMBULATORY_CARE_PROVIDER_SITE_OTHER): Payer: Self-pay | Admitting: Vascular Surgery

## 2017-06-26 ENCOUNTER — Ambulatory Visit (INDEPENDENT_AMBULATORY_CARE_PROVIDER_SITE_OTHER): Payer: Medicare Other | Admitting: Vascular Surgery

## 2017-06-26 ENCOUNTER — Encounter (INDEPENDENT_AMBULATORY_CARE_PROVIDER_SITE_OTHER): Payer: Self-pay | Admitting: Vascular Surgery

## 2017-06-26 VITALS — BP 161/89 | HR 66 | Resp 14 | Ht 69.0 in | Wt 121.0 lb

## 2017-06-26 DIAGNOSIS — I70211 Atherosclerosis of native arteries of extremities with intermittent claudication, right leg: Secondary | ICD-10-CM | POA: Diagnosis not present

## 2017-06-26 DIAGNOSIS — F17210 Nicotine dependence, cigarettes, uncomplicated: Secondary | ICD-10-CM | POA: Diagnosis not present

## 2017-06-26 DIAGNOSIS — I10 Essential (primary) hypertension: Secondary | ICD-10-CM | POA: Diagnosis not present

## 2017-06-26 DIAGNOSIS — F17200 Nicotine dependence, unspecified, uncomplicated: Secondary | ICD-10-CM

## 2017-06-26 NOTE — Patient Instructions (Signed)

## 2017-06-26 NOTE — Progress Notes (Signed)
MRN : [id number]  Cory Martin is a 62 y.o. (1955/10/02) male who presents with chief complaint of  Chief Complaint  Patient presents with  . Follow-up    Recheck both legs  .  History of Present Illness: Patient returns today in follow up of his peripheral arterial disease.  He describes worsening claudication symptoms in the right lower extremity.  This is the leg that has been worked on previously with the most recent intervention being about 3 years ago.  He was last seen about 8 months ago where he had a mild drop in his right ABI with some moderate stenoses seen on duplex.  He has not followed up.  He has continued tobacco use.  No open ulcerations or infection.  No swelling.  Symptoms are predominantly in the right leg with no significant claudication noticeable in the left leg currently.  Current Outpatient Medications  Medication Sig Dispense Refill  . amLODipine (NORVASC) 2.5 MG tablet     . aspirin 81 MG EC tablet TAKE 1 TABLET BY MOUTH EVERY DAY FOR HEART AND VASCULAR PROTECTION  3  . clopidogrel (PLAVIX) 75 MG tablet TAKE 1 TABLET BY MOUTH EVERY DAY 90 tablet 1  . lisinopril (PRINIVIL,ZESTRIL) 10 MG tablet     . pravastatin (PRAVACHOL) 20 MG tablet     . ranitidine (ZANTAC) 150 MG tablet TAKE 1 TABLET BY MOUTH EVERY DAY FOR HEARTBURN  3  . VENTOLIN HFA 108 (90 Base) MCG/ACT inhaler INHALE 2 PUFFS EVERY 4-6 HOURS AS NEEDED FOR WHEEZING.  0   No current facility-administered medications for this visit.     Past Medical History:  Diagnosis Date  . Arthritis   . Hyperlipidemia   . Hypertension     Past Surgical History:  Procedure Laterality Date  . TUMOR REMOVAL     abdominal    Social History Social History   Tobacco Use  . Smoking status: Current Every Day Smoker  . Smokeless tobacco: Never Used  Substance Use Topics  . Alcohol use: No  . Drug use: No     Family History Family History  Problem Relation Age of Onset  . Cancer Mother   . Cancer  Father   no bleeding or clotting disorders  No Known Allergies   REVIEW OF SYSTEMS (Negative unless checked)  Constitutional: [] Weight loss  [] Fever  [] Chills Cardiac: [] Chest pain   [] Chest pressure   [] Palpitations   [] Shortness of breath when laying flat   [] Shortness of breath at rest   [] Shortness of breath with exertion. Vascular:  [x] Pain in legs with walking   [] Pain in legs at rest   [] Pain in legs when laying flat   [x] Claudication   [] Pain in feet when walking  [] Pain in feet at rest  [] Pain in feet when laying flat   [] History of DVT   [] Phlebitis   [] Swelling in legs   [] Varicose veins   [] Non-healing ulcers Pulmonary:   [] Uses home oxygen   [] Productive cough   [] Hemoptysis   [] Wheeze  [] COPD   [] Asthma Neurologic:  [] Dizziness  [] Blackouts   [] Seizures   [] History of stroke   [] History of TIA  [] Aphasia   [] Temporary blindness   [] Dysphagia   [] Weakness or numbness in arms   [] Weakness or numbness in legs Musculoskeletal:  [x] Arthritis   [] Joint swelling   [x] Joint pain   [] Low back pain Hematologic:  [] Easy bruising  [] Easy bleeding   [] Hypercoagulable state   [] Anemic   Gastrointestinal:  []   Blood in stool   [] Vomiting blood  [] Gastroesophageal reflux/heartburn   [] Abdominal pain Genitourinary:  [] Chronic kidney disease   [] Difficult urination  [] Frequent urination  [] Burning with urination   [] Hematuria Skin:  [] Rashes   [] Ulcers   [] Wounds Psychological:  [] History of anxiety   []  History of major depression.    Physical Examination  BP (!) 161/89 (BP Location: Right Arm, Patient Position: Sitting)   Pulse 66   Resp 14   Ht 5\' 9"  (1.753 m)   Wt 121 lb (54.9 kg)   BMI 17.87 kg/m  Gen: Thin, NAD Head: [place]/AT, No temporalis wasting. Ear/Nose/Throat: Hearing grossly intact, nares w/o erythema or drainage Eyes: Conjunctiva clear. Sclera non-icteric Neck: Supple.  Trachea midline Pulmonary:  Good air movement, no use of accessory muscles.  Cardiac: RRR, no  JVD Vascular:  Vessel Right Left  Radial Palpable Palpable                          PT  1+ palpable  1+ palpable  DP  trace palpable Palpable   Gastrointestinal: soft, non-tender/non-distended. No guarding/reflex.  Musculoskeletal: M/S 5/5 throughout.  No deformity or atrophy. no edema. Neurologic: Sensation grossly intact in extremities.  Symmetrical.  Speech is fluent.  Psychiatric: Judgment intact, Mood & affect appropriate for pt's clinical situation. Dermatologic: No rashes or ulcers noted.  No cellulitis or open wounds.       Labs No results found for this or any previous visit (from the past 2160 hour(s)).  Radiology No results found.  Assessment/Plan Tobacco use disorder We had a discussion for approximately 3 minutes regarding the absolute need for smoking cessation due to the deleterious nature of tobacco on the vascular system. We discussed the tobacco use would diminish patency of any intervention, and likely significantly worsen progressio of disease. We discussed multiple agents for quitting including replacement therapy or medications to reduce cravings such as Chantix. The patient voices their understanding of the importance of smoking cessation.   Essential hypertension, benign blood pressure control important in reducing the progression of atherosclerotic disease. On appropriate oral medications.   Atherosclerosis of native arteries of extremity with intermittent claudication ([place]) Patient has a known history of peripheral arterial disease and has had 2 interventions in the past.  The same leg is now having short distance claudication again.  We will repeat ABIs but I suspect he will need an angiogram with possible intervention as his symptoms have now become lifestyle limiting.  Risks and benefits were discussed with the patient.  We will see him after his ABIs to discuss the results and determine whether or not to proceed with angiogram.    Leotis Pain,  MD  06/26/2017 9:08 AM    This note was created with Dragon medical transcription system.  Any errors from dictation are purely unintentional

## 2017-06-26 NOTE — Assessment & Plan Note (Signed)
Patient has a known history of peripheral arterial disease and has had 2 interventions in the past.  The same leg is now having short distance claudication again.  We will repeat ABIs but I suspect he will need an angiogram with possible intervention as his symptoms have now become lifestyle limiting.  Risks and benefits were discussed with the patient.  We will see him after his ABIs to discuss the results and determine whether or not to proceed with angiogram.

## 2017-07-06 ENCOUNTER — Ambulatory Visit (INDEPENDENT_AMBULATORY_CARE_PROVIDER_SITE_OTHER): Payer: Medicare Other | Admitting: Vascular Surgery

## 2017-07-06 ENCOUNTER — Ambulatory Visit (INDEPENDENT_AMBULATORY_CARE_PROVIDER_SITE_OTHER): Payer: Medicare Other

## 2017-07-06 ENCOUNTER — Encounter (INDEPENDENT_AMBULATORY_CARE_PROVIDER_SITE_OTHER): Payer: Self-pay | Admitting: Vascular Surgery

## 2017-07-06 VITALS — BP 194/89 | HR 57 | Resp 16 | Ht 69.0 in | Wt 121.0 lb

## 2017-07-06 DIAGNOSIS — I10 Essential (primary) hypertension: Secondary | ICD-10-CM | POA: Diagnosis not present

## 2017-07-06 DIAGNOSIS — E785 Hyperlipidemia, unspecified: Secondary | ICD-10-CM

## 2017-07-06 DIAGNOSIS — F17200 Nicotine dependence, unspecified, uncomplicated: Secondary | ICD-10-CM | POA: Diagnosis not present

## 2017-07-06 DIAGNOSIS — I70211 Atherosclerosis of native arteries of extremities with intermittent claudication, right leg: Secondary | ICD-10-CM | POA: Diagnosis not present

## 2017-07-06 NOTE — Progress Notes (Signed)
Subjective:    Patient ID: Cory Martin, male    DOB: 29-Aug-1955, 62 y.o.   MRN: [id number] Chief Complaint  Patient presents with  . Follow-up    asap abi   Patient presents to review vascular studies.  The patient was last seen on Jun 26, 2017 and evaluation of progressively worsening intermittent claudication-like symptoms.  The patient symptoms are stable.  The patient denies any rest pain or ulcer formation to the bilateral lower extremity.  The patient underwent a bilateral ABI which was notable for right: Biphasic tibials and left: Triphasic tibials.  Right: ABI 0.94 / Left: ABI 1.00 patient states the symptoms to his right lower extremity are worse when compared to the left.  When compared to the previous ABI conducted on October 20, 2016 there has been no change.  The patient denies any fever, nausea vomiting.  Review of Systems  Constitutional: Negative.   HENT: Negative.   Eyes: Negative.   Respiratory: Negative.   Cardiovascular:       Peripheral artery disease Claudication  Gastrointestinal: Negative.   Endocrine: Negative.   Genitourinary: Negative.   Musculoskeletal: Negative.   Skin: Negative.   Allergic/Immunologic: Negative.   Neurological: Negative.   Hematological: Negative.   Psychiatric/Behavioral: Negative.       Objective:   Physical Exam  Constitutional: He is oriented to person, place, and time. He appears well-developed and well-nourished. No distress.  HENT:  Head: Normocephalic and atraumatic.  Right Ear: External ear normal.  Left Ear: External ear normal.  Eyes: Pupils are equal, round, and reactive to light. Conjunctivae and EOM are normal.  Neck: Normal range of motion.  Cardiovascular: Normal rate, regular rhythm, normal heart sounds and intact distal pulses.  Pulses:      Radial pulses are 2+ on the right side, and 2+ on the left side.       Dorsalis pedis pulses are 1+ on the right side, and 1+ on the left side.       Posterior tibial  pulses are 1+ on the right side, and 1+ on the left side.  Pulmonary/Chest: Effort normal and breath sounds normal.  Musculoskeletal: Normal range of motion. He exhibits no edema.  Neurological: He is alert and oriented to person, place, and time.  Skin: Skin is warm and dry. He is not diaphoretic.  Psychiatric: He has a normal mood and affect. His behavior is normal. Judgment and thought content normal.  Vitals reviewed.  BP (!) 194/89 (BP Location: Right Arm)   Pulse (!) 57   Resp 16   Ht 5\' 9"  (1.753 m)   Wt 121 lb (54.9 kg)   BMI 17.87 kg/m   Past Medical History:  Diagnosis Date  . Arthritis   . Hyperlipidemia   . Hypertension    Social History   Socioeconomic History  . Marital status: Single    Spouse name: Not on file  . Number of children: Not on file  . Years of education: Not on file  . Highest education level: Not on file  Occupational History  . Not on file  Social Needs  . Financial resource strain: Not on file  . Food insecurity:    Worry: Not on file    Inability: Not on file  . Transportation needs:    Medical: Not on file    Non-medical: Not on file  Tobacco Use  . Smoking status: Current Every Day Smoker  . Smokeless tobacco: Never Used  Substance and Sexual  Activity  . Alcohol use: No  . Drug use: No  . Sexual activity: Not on file  Lifestyle  . Physical activity:    Days per week: Not on file    Minutes per session: Not on file  . Stress: Not on file  Relationships  . Social connections:    Talks on phone: Not on file    Gets together: Not on file    Attends religious service: Not on file    Active member of club or organization: Not on file    Attends meetings of clubs or organizations: Not on file    Relationship status: Not on file  . Intimate partner violence:    Fear of current or ex partner: Not on file    Emotionally abused: Not on file    Physically abused: Not on file    Forced sexual activity: Not on file  Other Topics  Concern  . Not on file  Social History Narrative  . Not on file   Past Surgical History:  Procedure Laterality Date  . TUMOR REMOVAL     abdominal   Family History  Problem Relation Age of Onset  . Cancer Mother   . Cancer Father    No Known Allergies     Assessment & Plan:  Patient presents to review vascular studies.  The patient was last seen on Jun 26, 2017 and evaluation of progressively worsening intermittent claudication-like symptoms.  The patient symptoms are stable.  The patient denies any rest pain or ulcer formation to the bilateral lower extremity.  The patient underwent a bilateral ABI which was notable for right: Biphasic tibials and left: Triphasic tibials.  Right: ABI 0.94 / Left: ABI 1.00 patient states the symptoms to his right lower extremity are worse when compared to the left.  When compared to the previous ABI conducted on October 20, 2016 there has been no change.  The patient denies any fever, nausea vomiting.  1. Atherosclerosis of native artery of right lower extremity with intermittent claudication ([place]) - Stable Patient presenting with claudication-like symptoms to the bilateral lower extremity, right > left. ABI today is stable. Palpable pulses on exam. I offered the patient a diagnostic angiogram as he is adamant his symptoms are worsening.  The patient is unsure if you wants to move forward with an angiogram at this time.  Procedure, risks and benefits were explained to the patient.  If the patient decides to move forward we will be happy to schedule him for a diagnostic angiogram If the patient does not move forward with a diagnostic angiogram encouraged him to follow-up with his primary care physician to rule out any joint or spine disease that may be contributing I will see him back in 6 months for an ABI and a bilateral lower extremity arterial duplex I have discussed with the patient at length the risk factors for and pathogenesis of atherosclerotic  disease and encouraged a healthy diet, regular exercise regimen and blood pressure / glucose control.  The patient was encouraged to call the office in the interim if he experiences any claudication like symptoms, rest pain or ulcers to his feet / toes.  - VAS [place] ABI WITH/WO TBI; Future - VAS [place] LOWER EXTREMITY ARTERIAL DUPLEX; Future  2. Tobacco use disorder - Stable We had a discussion for approximately 10 minutes regarding the absolute need for smoking cessation due to the deleterious nature of tobacco on the vascular system. We discussed the tobacco use would diminish  patency of any intervention, and likely significantly worsen progressio of disease. We discussed multiple agents for quitting including replacement therapy or medications to reduce cravings such as Chantix. The patient voices their understanding of the importance of smoking cessation.  3. Hyperlipidemia, unspecified hyperlipidemia type - Stable Encouraged good control as its slows the progression of atherosclerotic disease  4. Essential hypertension, benign - Stable Encouraged good control as its slows the progression of atherosclerotic disease  Current Outpatient Medications on File Prior to Visit  Medication Sig Dispense Refill  . amLODipine (NORVASC) 2.5 MG tablet     . aspirin 81 MG EC tablet TAKE 1 TABLET BY MOUTH EVERY DAY FOR HEART AND VASCULAR PROTECTION  3  . clopidogrel (PLAVIX) 75 MG tablet TAKE 1 TABLET BY MOUTH EVERY DAY 90 tablet 1  . lisinopril (PRINIVIL,ZESTRIL) 10 MG tablet     . pravastatin (PRAVACHOL) 20 MG tablet     . ranitidine (ZANTAC) 150 MG tablet TAKE 1 TABLET BY MOUTH EVERY DAY FOR HEARTBURN  3  . VENTOLIN HFA 108 (90 Base) MCG/ACT inhaler INHALE 2 PUFFS EVERY 4-6 HOURS AS NEEDED FOR WHEEZING.  0   No current facility-administered medications on file prior to visit.    There are no Patient Instructions on file for this visit. No follow-ups on file.  Cherokee Clowers A Bransyn Adami, PA-C

## 2017-09-09 ENCOUNTER — Other Ambulatory Visit (INDEPENDENT_AMBULATORY_CARE_PROVIDER_SITE_OTHER): Payer: Self-pay | Admitting: Vascular Surgery

## 2017-10-26 ENCOUNTER — Encounter (INDEPENDENT_AMBULATORY_CARE_PROVIDER_SITE_OTHER): Payer: Self-pay

## 2017-11-02 ENCOUNTER — Other Ambulatory Visit (INDEPENDENT_AMBULATORY_CARE_PROVIDER_SITE_OTHER): Payer: Self-pay | Admitting: Vascular Surgery

## 2017-11-04 MED ORDER — CEFAZOLIN SODIUM-DEXTROSE 2-4 GM/100ML-% IV SOLN
2.0000 g | Freq: Once | INTRAVENOUS | Status: AC
  Administered 2017-11-05: 2 g via INTRAVENOUS

## 2017-11-05 ENCOUNTER — Encounter
Admission: RE | Disposition: A | Discharge status: Home / Self Care | Payer: Self-pay | Source: Ambulatory Visit | Attending: Vascular Surgery

## 2017-11-05 ENCOUNTER — Encounter: Payer: Self-pay | Admitting: *Deleted

## 2017-11-05 ENCOUNTER — Ambulatory Visit
Admission: RE | Admit: 2017-11-05 | Discharge: 2017-11-05 | Disposition: A | Discharge status: Home / Self Care | Payer: Medicare Other | Source: Ambulatory Visit | Attending: Vascular Surgery | Admitting: Vascular Surgery

## 2017-11-05 DIAGNOSIS — F17210 Nicotine dependence, cigarettes, uncomplicated: Secondary | ICD-10-CM | POA: Diagnosis not present

## 2017-11-05 DIAGNOSIS — Z8509 Personal history of malignant neoplasm of other digestive organs: Secondary | ICD-10-CM | POA: Diagnosis not present

## 2017-11-05 DIAGNOSIS — I10 Essential (primary) hypertension: Secondary | ICD-10-CM | POA: Diagnosis not present

## 2017-11-05 DIAGNOSIS — M1990 Unspecified osteoarthritis, unspecified site: Secondary | ICD-10-CM | POA: Diagnosis not present

## 2017-11-05 DIAGNOSIS — Z452 Encounter for adjustment and management of vascular access device: Secondary | ICD-10-CM

## 2017-11-05 DIAGNOSIS — Z85028 Personal history of other malignant neoplasm of stomach: Secondary | ICD-10-CM | POA: Diagnosis not present

## 2017-11-05 DIAGNOSIS — E785 Hyperlipidemia, unspecified: Secondary | ICD-10-CM | POA: Insufficient documentation

## 2017-11-05 DIAGNOSIS — K219 Gastro-esophageal reflux disease without esophagitis: Secondary | ICD-10-CM | POA: Insufficient documentation

## 2017-11-05 DIAGNOSIS — C169 Malignant neoplasm of stomach, unspecified: Secondary | ICD-10-CM

## 2017-11-05 DIAGNOSIS — Z08 Encounter for follow-up examination after completed treatment for malignant neoplasm: Secondary | ICD-10-CM | POA: Diagnosis not present

## 2017-11-05 HISTORY — DX: Gastro-esophageal reflux disease without esophagitis: K21.9

## 2017-11-05 HISTORY — PX: PORTA CATH REMOVAL: CATH118286

## 2017-11-05 HISTORY — DX: Malignant neoplasm of stomach, unspecified: C16.9

## 2017-11-05 SURGERY — PORTA CATH REMOVAL
Anesthesia: Moderate Sedation

## 2017-11-05 MED ORDER — HYDROMORPHONE HCL 1 MG/ML IJ SOLN: 1.0000 mg | Freq: Once | INTRAMUSCULAR | Status: DC | PRN

## 2017-11-05 MED ORDER — MIDAZOLAM HCL 5 MG/5ML IJ SOLN
INTRAMUSCULAR | Status: AC
  Filled 2017-11-05: qty 5

## 2017-11-05 MED ORDER — SODIUM CHLORIDE 0.9 % IV SOLN
INTRAVENOUS | Status: DC
  Administered 2017-11-05: 14:00:00 via INTRAVENOUS

## 2017-11-05 MED ORDER — FENTANYL CITRATE (PF) 100 MCG/2ML IJ SOLN
INTRAMUSCULAR | Status: AC
  Filled 2017-11-05: qty 2

## 2017-11-05 MED ORDER — MIDAZOLAM HCL 2 MG/2ML IJ SOLN
INTRAMUSCULAR | Status: DC | PRN
  Administered 2017-11-05: 2 mg via INTRAVENOUS

## 2017-11-05 MED ORDER — FENTANYL CITRATE (PF) 100 MCG/2ML IJ SOLN
INTRAMUSCULAR | Status: DC | PRN
  Administered 2017-11-05: 50 ug via INTRAVENOUS

## 2017-11-05 MED ORDER — ONDANSETRON HCL 4 MG/2ML IJ SOLN: 4.0000 mg | Freq: Four times a day (QID) | INTRAMUSCULAR | Status: DC | PRN

## 2017-11-05 SURGICAL SUPPLY — 7 items
DERMABOND ADVANCED (GAUZE/BANDAGES/DRESSINGS) ×2
DERMABOND ADVANCED .7 DNX12 (GAUZE/BANDAGES/DRESSINGS) ×1 IMPLANT
PACK ANGIOGRAPHY (CUSTOM PROCEDURE TRAY) ×3 IMPLANT
PENCIL ELECTRO HAND CTR (MISCELLANEOUS) ×3 IMPLANT
SUT MNCRL AB 4-0 PS2 18 (SUTURE) ×3 IMPLANT
SUT VIC AB 3-0 SH 27 (SUTURE) ×2
SUT VIC AB 3-0 SH 27X BRD (SUTURE) ×1 IMPLANT

## 2017-11-05 NOTE — Discharge Instructions (Signed)
Incision Care, Adult °An incision is a surgical cut that is made through your skin. Most incisions are closed after surgery. Your incision may be closed with stitches (sutures), staples, skin glue, or adhesive strips. You may need to return to your health care provider to have sutures or staples removed. This may occur several days to several weeks after your surgery. The incision needs to be cared for properly to prevent infection. °How to care for your incision °Incision care ° °· Follow instructions from your health care provider about how to take care of your incision. Make sure you: °? Wash your hands with soap and water before you change the bandage (dressing). If soap and water are not available, use hand sanitizer. °? Change your dressing as told by your health care provider. °? Leave sutures, skin glue, or adhesive strips in place. These skin closures may need to stay in place for 2 weeks or longer. If adhesive strip edges start to loosen and curl up, you may trim the loose edges. Do not remove adhesive strips completely unless your health care provider tells you to do that. °· Check your incision area every day for signs of infection. Check for: °? More redness, swelling, or pain. °? More fluid or blood. °? Warmth. °? Pus or a bad smell. °· Ask your health care provider how to clean the incision. This may include: °? Using mild soap and water. °? Using a clean towel to pat the incision dry after cleaning it. °? Applying a cream or ointment. Do this only as told by your health care provider. °? Covering the incision with a clean dressing. °· Ask your health care provider when you can leave the incision uncovered. °· Do not take baths, swim, or use a hot tub until your health care provider approves. Ask your health care provider if you can take showers. You may only be allowed to take sponge baths for bathing. °Medicines °· If you were prescribed an antibiotic medicine, cream, or ointment, take or apply the  antibiotic as told by your health care provider. Do not stop taking or applying the antibiotic even if your condition improves. °· Take over-the-counter and prescription medicines only as told by your health care provider. °General instructions °· Limit movement around your incision to improve healing. °? Avoid straining, lifting, or exercise for the first month, or for as long as told by your health care provider. °? Follow instructions from your health care provider about returning to your normal activities. °? Ask your health care provider what activities are safe. °· Protect your incision from the sun when you are outside for the first 6 months, or for as long as told by your health care provider. Apply sunscreen around the scar or cover it up. °· Keep all follow-up visits as told by your health care provider. This is important. °Contact a health care provider if: °· Your have more redness, swelling, or pain around the incision. °· You have more fluid or blood coming from the incision. °· Your incision feels warm to the touch. °· You have pus or a bad smell coming from the incision. °· You have a fever or shaking chills. °· You are nauseous or you vomit. °· You are dizzy. °· Your sutures or staples come undone. °Get help right away if: °· You have a red streak coming from your incision. °· Your incision bleeds through the dressing and the bleeding does not stop with gentle pressure. °· The edges of   your incision open up and separate. °· You have severe pain. °· You have a rash. °· You are confused. °· You faint. °· You have trouble breathing and a fast heartbeat. °This information is not intended to replace advice given to you by your health care provider. Make sure you discuss any questions you have with your health care provider. °Document Released: 08/19/2004 Document Revised: 10/08/2015 Document Reviewed: 08/18/2015 °Elsevier Interactive Patient Education © 2018 [employer] °Moderate Conscious Sedation,  Adult, Care After °These instructions provide you with information about caring for yourself after your procedure. Your health care provider may also give you more specific instructions. Your treatment has been planned according to current medical practices, but problems sometimes occur. Call your health care provider if you have any problems or questions after your procedure. °What can I expect after the procedure? °After your procedure, it is common: °· To feel sleepy for several hours. °· To feel clumsy and have poor balance for several hours. °· To have poor judgment for several hours. °· To vomit if you eat too soon. ° °Follow these instructions at home: °For at least 24 hours after the procedure: ° °· Do not: °? Participate in activities where you could fall or become injured. °? Drive. °? Use heavy machinery. °? Drink alcohol. °? Take sleeping pills or medicines that cause drowsiness. °? Make important decisions or sign legal documents. °? Take care of children on your own. °· Rest. °Eating and drinking °· Follow the diet recommended by your health care provider. °· If you vomit: °? Drink water, juice, or soup when you can drink without vomiting. °? Make sure you have little or no nausea before eating solid foods. °General instructions °· Have a responsible adult stay with you until you are awake and alert. °· Take over-the-counter and prescription medicines only as told by your health care provider. °· If you smoke, do not smoke without supervision. °· Keep all follow-up visits as told by your health care provider. This is important. °Contact a health care provider if: °· You keep feeling nauseous or you keep vomiting. °· You feel light-headed. °· You develop a rash. °· You have a fever. °Get help right away if: °· You have trouble breathing. °This information is not intended to replace advice given to you by your health care provider. Make sure you discuss any questions you have with your health care  provider. °Document Released: 11/20/2012 Document Revised: 07/05/2015 Document Reviewed: 05/22/2015 °Elsevier Interactive Patient Education © 2018 [employer] ° °

## 2017-11-05 NOTE — Op Note (Signed)
[facility]       Operative Note  Date: 11/05/2017  Preoperative diagnosis:  1. Gastric cancer, remote and completed therapy no longer using port  Postoperative diagnosis:  Same as above  Procedures: #1. Removal of left subclavian port a cath   Surgeon: Leotis Pain, MD  Anesthesia: Local with moderate conscious sedation for 25 minutes using 3 mg of Versed and 50 mcg of Fentanyl  Fluoroscopy time: less than one minute  Contrast used: 0  Estimated blood loss: Minimal  Indication for the procedure:  The patient is a 62 y.o. male who has remote history of gastric cancer and has competed therapy and no longer needs their Port-A-Cath. The patient desires to have this removed. Risks and benefits including need for potential replacement with recurrent disease were discussed and patient is agreeable to proceed.  Description of procedure: The patient was brought to the vascular and interventional radiology suite. Moderate conscious sedation was administered during a face to face encounter with the patient throughout the procedure with my supervision of the RN administering medicines and monitoring the patient's vital signs, pulse oximetry, telemetry and mental status throughout from the start of the procedure until the patient was taken to the recovery room.  The left neck chest and shoulder were sterilely prepped and draped, and a sterile surgical field was created. The area was then anesthetized with 1% lidocaine copiously. The previous incision was reopened and electrocautery used to dissected down to the port and the catheter. These were dissected free and the catheter was identified.  It was densely adherent and did not slide out easily as typical.  I actually had to sequentially dilate down to the subclavicular space to free up the area under the clavicle to allow the catheter to be removed.  It was still removed with some resistance  and I actually used fluoroscopy to ensure that none of the catheter remained in that there would not be retained catheter.  Carefully, the catheter was removed in its entirety and fluoroscopy confirmed that there was no retained catheter. The port was dissected out from the fibrous connective tissue and the Prolene sutures were removed. The port was then removed in its entirety including the catheter. The wound was then closed with a 3-0 Vicryl and a 4-0 Monocryl and Dermabond was placed as a dressing. The patient was then taken to the recovery room in stable condition having tolerated the procedure well.  Complications: none  Condition: stable   Leotis Pain, MD 11/05/2017 3:25 PM   This note was created with Dragon Medical transcription system. Any errors in dictation are purely unintentional.

## 2017-11-05 NOTE — H&P (Signed)
[facility] Admission History & Physical  MRN : [id number]  Cory Martin is a 62 y.o. ([birth year]) male who presents with chief complaint of No chief complaint on file. Marland Kitchen  History of Present Illness: Patient presents for removal of his Port-A-Cath.  He has a history of gastric cancer but has long completed his therapy and desires to have his port removed.  No fevers or chills.  No pain overlying the port.  No other issues other than not needing the port any longer.  Current Facility-Administered Medications  Medication Dose Route Frequency Provider Last Rate Last Dose  . 0.9 %  sodium chloride infusion   Intravenous Continuous Stegmayer, Kimberly A, PA-C 75 mL/hr at 11/05/17 1353    . ceFAZolin (ANCEF) IVPB 2g/100 mL premix  2 g Intravenous Once Stegmayer, Kimberly A, PA-C      . HYDROmorphone (DILAUDID) injection 1 mg  1 mg Intravenous Once PRN Stegmayer, Kimberly A, PA-C      . ondansetron (ZOFRAN) injection 4 mg  4 mg Intravenous Q6H PRN Stegmayer, Janalyn Harder, PA-C        Past Medical History:  Diagnosis Date  . Arthritis   . GERD (gastroesophageal reflux disease)   . Hyperlipidemia   . Hypertension   . Stomach cancer [facility])     Past Surgical History:  Procedure Laterality Date  . TUMOR REMOVAL     abdominal    Social History Social History   Tobacco Use  . Smoking status: Current Every Day Smoker    Packs/day: 0.25    Years: 43.00    Pack years: 10.75    Types: Cigarettes  . Smokeless tobacco: Never Used  Substance Use Topics  . Alcohol use: No  . Drug use: No    Family History Family History  Problem Relation Age of Onset  . Cancer Mother   . Cancer Father   No bleeding or clotting disorders  No Known Allergies   REVIEW OF SYSTEMS (Negative unless checked)  Constitutional: [] Weight loss  [] Fever  [] Chills Cardiac: [] Chest pain   [] Chest pressure   [] Palpitations   [] Shortness of breath when laying flat   [] Shortness of breath  at rest   [] Shortness of breath with exertion. Vascular:  [] Pain in legs with walking   [] Pain in legs at rest   [] Pain in legs when laying flat   [] Claudication   [] Pain in feet when walking  [] Pain in feet at rest  [] Pain in feet when laying flat   [] History of DVT   [] Phlebitis   [] Swelling in legs   [] Varicose veins   [] Non-healing ulcers Pulmonary:   [] Uses home oxygen   [] Productive cough   [] Hemoptysis   [] Wheeze  [] COPD   [] Asthma Neurologic:  [] Dizziness  [] Blackouts   [] Seizures   [] History of stroke   [] History of TIA  [] Aphasia   [] Temporary blindness   [] Dysphagia   [] Weakness or numbness in arms   [] Weakness or numbness in legs Musculoskeletal:  [x] Arthritis   [] Joint swelling   [] Joint pain   [] Low back pain Hematologic:  [] Easy bruising  [] Easy bleeding   [] Hypercoagulable state   [] Anemic  [] Hepatitis Gastrointestinal:  [] Blood in stool   [] Vomiting blood  [x] Gastroesophageal reflux/heartburn   [] Difficulty swallowing. Genitourinary:  [] Chronic kidney disease   [] Difficult urination  [] Frequent urination  [] Burning with urination   [] Blood in urine Skin:  [] Rashes   [] Ulcers   [] Wounds Psychological:  [] History of anxiety   []  History  of major depression.  Physical Examination  Vitals:   11/05/17 1343  BP: (!) 194/91  Pulse: 70  Resp: 14  Temp: 97.8 F (36.6 C)  TempSrc: Oral  SpO2: 100%  Weight: 58.1 kg  Height: 5\' 9"  (1.753 m)   Body mass index is 18.9 kg/m. Gen: WD/WN, NAD Head: [place]/AT, No temporalis wasting.  Ear/Nose/Throat: Hearing grossly intact, nares w/o erythema or drainage, oropharynx w/o Erythema/Exudate,  Eyes: Conjunctiva clear, sclera non-icteric Neck: Trachea midline.  No JVD.  Pulmonary:  Good air movement, respirations not labored, no use of accessory muscles.  Cardiac: RRR, normal S1, S2. Vascular: Port in right subclavicular location without erythema or tenderness Vessel Right Left  Radial Palpable Palpable                                     Musculoskeletal: M/S 5/5 throughout.  Extremities without ischemic changes.  No deformity or atrophy.  Neurologic: Sensation grossly intact in extremities.  Symmetrical.  Speech is fluent. Motor exam as listed above. Psychiatric: Judgment intact, Mood & affect appropriate for pt's clinical situation. Dermatologic: No rashes or ulcers noted.  No cellulitis or open wounds.      CBC Lab Results  Component Value Date   WBC 5.9 03/10/2011   HGB 15.5 03/10/2011   HCT 46.1 03/10/2011   MCV 90 03/10/2011   PLT 245 03/10/2011    BMET    Component Value Date/Time   BUN 12 03/16/2014 0731   CREATININE 1.17 03/16/2014 0731   GFRNONAA >60 03/16/2014 0731   GFRNONAA >60 05/19/2013 0803   GFRAA >60 03/16/2014 0731   GFRAA >60 05/19/2013 0803   CrCl cannot be calculated (Patient's most recent lab result is older than the maximum 21 days allowed.).  COAG No results found for: INR, PROTIME  Radiology No results found.   Assessment/Plan 1.  Gastric cancer.  Completed therapy.  Desires to have port removed.  This will be removed today.  Risks and benefits are discussed 2.  Hypertension.  Stable on outpatient medications and blood pressure control important in reducing the progression of atherosclerotic disease. On appropriate oral medications. 3.  Hyperlipidemia.  Stable on outpatient medications and lipid control important in reducing the progression of atherosclerotic disease. Continue statin therapy    Leotis Pain, MD  11/05/2017 2:08 PM

## 2017-11-06 ENCOUNTER — Encounter: Payer: Self-pay | Admitting: Vascular Surgery

## 2018-03-11 ENCOUNTER — Other Ambulatory Visit (INDEPENDENT_AMBULATORY_CARE_PROVIDER_SITE_OTHER): Payer: Self-pay | Admitting: Vascular Surgery

## 2018-07-16 ENCOUNTER — Encounter (INDEPENDENT_AMBULATORY_CARE_PROVIDER_SITE_OTHER): Payer: Medicare Other

## 2018-07-16 ENCOUNTER — Ambulatory Visit (INDEPENDENT_AMBULATORY_CARE_PROVIDER_SITE_OTHER): Payer: Medicare Other | Admitting: Vascular Surgery

## 2018-09-05 ENCOUNTER — Other Ambulatory Visit (INDEPENDENT_AMBULATORY_CARE_PROVIDER_SITE_OTHER): Payer: Self-pay | Admitting: Vascular Surgery

## 2018-09-05 NOTE — Telephone Encounter (Signed)
Patient needs to schedule apt to be seen. AS, CMA

## 2018-12-01 ENCOUNTER — Other Ambulatory Visit (INDEPENDENT_AMBULATORY_CARE_PROVIDER_SITE_OTHER): Payer: Self-pay | Admitting: Vascular Surgery

## 2018-12-02 NOTE — Telephone Encounter (Signed)
Patient will have to schedule apt for further refills.

## 2018-12-24 ENCOUNTER — Other Ambulatory Visit (INDEPENDENT_AMBULATORY_CARE_PROVIDER_SITE_OTHER): Payer: Self-pay | Admitting: Vascular Surgery
